# Patient Record
Sex: Female | Born: 1975 | Race: Black or African American | Hispanic: No | Marital: Single | Smoking: Never smoker
Health system: Southern US, Community
[De-identification: ages and names within clinical notes are randomized; demographics above are authoritative.]

## PROBLEM LIST (undated history)

## (undated) DIAGNOSIS — K219 Gastro-esophageal reflux disease without esophagitis: Secondary | ICD-10-CM

## (undated) DIAGNOSIS — R011 Cardiac murmur, unspecified: Secondary | ICD-10-CM

## (undated) DIAGNOSIS — F419 Anxiety disorder, unspecified: Secondary | ICD-10-CM

## (undated) HISTORY — DX: Gastro-esophageal reflux disease without esophagitis: K21.9

## (undated) HISTORY — DX: Anxiety disorder, unspecified: F41.9

## (undated) HISTORY — DX: Cardiac murmur, unspecified: R01.1

---

## 2000-06-09 ENCOUNTER — Emergency Department (HOSPITAL_COMMUNITY): Admission: EM | Admit: 2000-06-09 | Discharge: 2000-06-09 | Payer: Self-pay | Admitting: Internal Medicine

## 2001-04-26 ENCOUNTER — Encounter: Payer: Self-pay | Admitting: *Deleted

## 2001-04-26 ENCOUNTER — Ambulatory Visit (HOSPITAL_COMMUNITY): Admission: RE | Admit: 2001-04-26 | Discharge: 2001-04-26 | Payer: Self-pay | Admitting: *Deleted

## 2001-05-26 ENCOUNTER — Encounter: Payer: Self-pay | Admitting: *Deleted

## 2001-05-26 ENCOUNTER — Ambulatory Visit (HOSPITAL_COMMUNITY): Admission: RE | Admit: 2001-05-26 | Discharge: 2001-05-26 | Payer: Self-pay | Admitting: *Deleted

## 2001-09-13 ENCOUNTER — Encounter: Payer: Self-pay | Admitting: Internal Medicine

## 2001-09-13 ENCOUNTER — Ambulatory Visit (HOSPITAL_COMMUNITY): Admission: RE | Admit: 2001-09-13 | Discharge: 2001-09-13 | Payer: Self-pay | Admitting: Internal Medicine

## 2001-10-25 DIAGNOSIS — R011 Cardiac murmur, unspecified: Secondary | ICD-10-CM

## 2001-10-25 HISTORY — DX: Cardiac murmur, unspecified: R01.1

## 2001-12-19 ENCOUNTER — Other Ambulatory Visit: Admission: RE | Admit: 2001-12-19 | Discharge: 2001-12-19 | Payer: Self-pay | Admitting: Obstetrics and Gynecology

## 2002-03-05 ENCOUNTER — Ambulatory Visit (HOSPITAL_COMMUNITY): Admission: RE | Admit: 2002-03-05 | Discharge: 2002-03-05 | Payer: Self-pay | Admitting: Obstetrics and Gynecology

## 2002-03-05 ENCOUNTER — Encounter: Payer: Self-pay | Admitting: Obstetrics and Gynecology

## 2002-05-09 ENCOUNTER — Inpatient Hospital Stay (HOSPITAL_COMMUNITY): Admission: AD | Admit: 2002-05-09 | Discharge: 2002-05-09 | Payer: Self-pay | Admitting: Obstetrics and Gynecology

## 2002-05-18 ENCOUNTER — Ambulatory Visit (HOSPITAL_COMMUNITY): Admission: RE | Admit: 2002-05-18 | Discharge: 2002-05-18 | Payer: Self-pay | Admitting: Obstetrics and Gynecology

## 2002-05-18 ENCOUNTER — Encounter: Payer: Self-pay | Admitting: Obstetrics and Gynecology

## 2002-06-20 ENCOUNTER — Encounter: Payer: Self-pay | Admitting: Obstetrics and Gynecology

## 2002-06-20 ENCOUNTER — Ambulatory Visit (HOSPITAL_COMMUNITY): Admission: RE | Admit: 2002-06-20 | Discharge: 2002-06-20 | Payer: Self-pay | Admitting: Obstetrics and Gynecology

## 2002-06-29 ENCOUNTER — Inpatient Hospital Stay (HOSPITAL_COMMUNITY): Admission: AD | Admit: 2002-06-29 | Discharge: 2002-07-01 | Payer: Self-pay | Admitting: Obstetrics and Gynecology

## 2002-11-26 ENCOUNTER — Other Ambulatory Visit: Admission: RE | Admit: 2002-11-26 | Discharge: 2002-11-26 | Payer: Self-pay | Admitting: Obstetrics and Gynecology

## 2006-01-08 ENCOUNTER — Emergency Department (HOSPITAL_COMMUNITY): Admission: EM | Admit: 2006-01-08 | Discharge: 2006-01-08 | Payer: Self-pay | Admitting: Emergency Medicine

## 2013-06-01 ENCOUNTER — Other Ambulatory Visit: Payer: Self-pay | Admitting: Family Medicine

## 2013-06-01 DIAGNOSIS — R109 Unspecified abdominal pain: Secondary | ICD-10-CM

## 2013-06-01 DIAGNOSIS — R110 Nausea: Secondary | ICD-10-CM

## 2013-06-04 ENCOUNTER — Ambulatory Visit
Admission: RE | Admit: 2013-06-04 | Discharge: 2013-06-04 | Disposition: A | Discharge status: Home / Self Care | Payer: BC Managed Care – PPO | Source: Ambulatory Visit | Attending: Family Medicine | Admitting: Family Medicine

## 2013-06-04 DIAGNOSIS — R109 Unspecified abdominal pain: Secondary | ICD-10-CM

## 2013-06-04 DIAGNOSIS — R110 Nausea: Secondary | ICD-10-CM

## 2013-07-03 ENCOUNTER — Telehealth: Payer: Self-pay | Admitting: Internal Medicine

## 2013-07-03 ENCOUNTER — Encounter: Payer: Self-pay | Admitting: Internal Medicine

## 2013-07-03 NOTE — Telephone Encounter (Signed)
Spoke with patient and offered OV with extender this week. She is unable to do this. Scheduled with Mike Gip, PA on 07/10/14 at 9:30 AM.

## 2013-07-10 ENCOUNTER — Ambulatory Visit (INDEPENDENT_AMBULATORY_CARE_PROVIDER_SITE_OTHER): Payer: BC Managed Care – PPO | Admitting: Physician Assistant

## 2013-07-10 ENCOUNTER — Other Ambulatory Visit (INDEPENDENT_AMBULATORY_CARE_PROVIDER_SITE_OTHER): Payer: BC Managed Care – PPO

## 2013-07-10 ENCOUNTER — Encounter: Payer: Self-pay | Admitting: Physician Assistant

## 2013-07-10 VITALS — BP 102/68 | HR 81 | Ht 66.0 in | Wt 170.0 lb

## 2013-07-10 DIAGNOSIS — R1013 Epigastric pain: Secondary | ICD-10-CM

## 2013-07-10 DIAGNOSIS — K219 Gastro-esophageal reflux disease without esophagitis: Secondary | ICD-10-CM

## 2013-07-10 DIAGNOSIS — R110 Nausea: Secondary | ICD-10-CM

## 2013-07-10 LAB — LIPASE: Lipase: 24 U/L (ref 11.0–59.0)

## 2013-07-10 LAB — CBC WITH DIFFERENTIAL/PLATELET
Eosinophils Relative: 1.7 % (ref 0.0–5.0)
HCT: 38.5 % (ref 36.0–46.0)
Lymphs Abs: 1.8 10*3/uL (ref 0.7–4.0)
Monocytes Relative: 7.5 % (ref 3.0–12.0)
Platelets: 246 10*3/uL (ref 150.0–400.0)
RBC: 3.98 Mil/uL (ref 3.87–5.11)
WBC: 5.5 10*3/uL (ref 4.5–10.5)

## 2013-07-10 LAB — COMPREHENSIVE METABOLIC PANEL
Albumin: 3.9 g/dL (ref 3.5–5.2)
CO2: 27 mEq/L (ref 19–32)
GFR: 100.1 mL/min (ref >60.00)
Glucose, Bld: 88 mg/dL (ref 70–99)
Potassium: 3.7 mEq/L (ref 3.5–5.1)
Sodium: 139 mEq/L (ref 135–145)
Total Bilirubin: 0.4 mg/dL (ref 0.3–1.2)
Total Protein: 7.3 g/dL (ref 6.0–8.3)

## 2013-07-10 MED ORDER — HYOSCYAMINE SULFATE 0.125 MG SL SUBL: 0.1250 mg | SUBLINGUAL_TABLET | SUBLINGUAL | Status: AC | PRN

## 2013-07-10 MED ORDER — ONDANSETRON HCL 4 MG PO TABS
ORAL_TABLET | ORAL | Status: AC
Start: 2013-07-10 — End: ?

## 2013-07-10 NOTE — Patient Instructions (Addendum)
Please go to the basement level to have your labs drawn.  Stay on the Prilosec 20 mg once daily. We sent prescriptions for Zofran ( Ondansetron ) for nausea and Levsin Sublingual for spasms to  [facility] [place].   We scheduled the [facility] for 07-19-2013 at 9 Am. Arrive at 8:45 Am. Have nothing after midnight. Do not take the Prilosec ( Omeprazole ) that morning.   Location is [facility].  Entrance A, [address].

## 2013-07-10 NOTE — Progress Notes (Signed)
Reviewed and agree.

## 2013-07-10 NOTE — Progress Notes (Signed)
Subjective:    Patient ID: Pamela Jackson, female    DOB: 1976-08-05, 37 y.o.   MRN: [id number]  HPI  Pamela Jackson is a very nice 38 year old African American female known remotely to Dr. Juanda Chance from the evaluation done in 2002. She did not have any endoscopic procedures . She comes in today with complaints of intermittent episodes of abdominal pain which have been occurring since May of 2014 . She describes these as attacks or episodes and says that at times the abdominal pain is severe and may last 6-8 hours. She feels the pain in her epigastrium without radiation. Attacks are always associated with nausea without vomiting and her appetite is decreased during that time. She last had a severe attack about 5 days ago and then 2 weeks prior to that. In between these episodes she may have some days without any discomfort and other days with very mild symptoms. He says when she gets these attacks this it is a burning and pulling type of pain that is associated with gas and bloating as well as nausea. She has not had any change in her bowel habits, no melena or hematochezia. She is bothered by intermittent heartburn indigestion and feels that this epigastric pain is a completely separate problem She's not on any regular aspirin or NSAIDs. She has taken Prilosec off and on over the past 1-1/2 years and has been taking it regularly recently without any improvement in her episodic pain. She did undergo upper abdominal ultrasound through her primary care provider in August of 2014 and this was negative with no evidence of gallstones. She's not had any recent labs.    Review of Systems  Constitutional: Negative.   HENT: Negative.   Eyes: Negative.   Respiratory: Negative.   Cardiovascular: Negative.   Gastrointestinal: Positive for nausea and abdominal pain.  Endocrine: Negative.   Genitourinary: Negative.   Musculoskeletal: Negative.   Skin: Negative.   Allergic/Immunologic: Negative.   Neurological:  Negative.   Hematological: Negative.   Psychiatric/Behavioral: Negative.    Outpatient Encounter Prescriptions as of 07/10/2013  Medication Sig Dispense Refill  . omeprazole (PRILOSEC) 20 MG capsule Take 20 mg by mouth 2 (two) times daily.      . hyoscyamine (LEVSIN SL) 0.125 MG SL tablet Place 1 tablet (0.125 mg total) under the tongue every 4 (four) hours as needed for cramping.  20 tablet  0  . ondansetron (ZOFRAN) 4 MG tablet Take 1 tab every 6 hours as needed for pain.  30 tablet  0   No facility-administered encounter medications on file as of 07/10/2013.    No Known Allergies There are no active problems to display for this patient.  History  Substance Use Topics  . Smoking status: Never Smoker   . Smokeless tobacco: Never Used  . Alcohol Use: No   family history includes Breast cancer in her mother; Diabetes in her maternal grandmother and paternal grandmother; Lung cancer in her father.     Objective:   Physical Exam  well-developed healthy-appearing young African American female in no acute distress, pleasant. Blood pressure 102/68 pulse 80 one height 5 foot 6 weight 170. HEENT; nontraumatic normocephalic EOMI PERRLA sclera anicteric, Supple; no JVD, Cardiovascula;r regular rate and rhythm with S1-S2 no murmur or gallop, Pulmonary; clear bilaterally, Abdomen; soft basically nontender nondistended bowel sounds are active there is no palpable mass or hepatosplenomegaly, Recta;l exam not done, Extremities; no clubbing cyanosis or edema skin warm and dry, Psych; mood and affect normal and  appropriate        Assessment & Plan:  #23  37 year old female with intermittent episodes of epigastric pain and nausea lasting 6-8 hours over the past several months. Her symptoms are nonresponsive to PPI therapy and upper abdominal ultrasound was negative. Etiology of her pain is unclear, will need to rule out biliary dyskinesia, IBS, gastritis or peptic ulcer disease  Plan; CBC with  differential, CMET,and lipase Schedule for CCK HIDA scan If above workup is unrevealing she will need EGD with Dr. Juanda Chance Continue Prilosec 20 mg by mouth daily in the interim Add Zofran 4 mg every 6 hours when necessary nausea Add Levsin sublingual one on the tongue at the onset of any episode of significant pain

## 2013-07-19 ENCOUNTER — Other Ambulatory Visit: Payer: Self-pay | Admitting: *Deleted

## 2013-07-19 ENCOUNTER — Encounter (HOSPITAL_COMMUNITY)
Admission: RE | Admit: 2013-07-19 | Discharge: 2013-07-19 | Disposition: A | Discharge status: Home / Self Care | Payer: BC Managed Care – PPO | Source: Ambulatory Visit | Attending: Physician Assistant | Admitting: Physician Assistant

## 2013-07-19 DIAGNOSIS — R1013 Epigastric pain: Secondary | ICD-10-CM | POA: Insufficient documentation

## 2013-07-19 DIAGNOSIS — K219 Gastro-esophageal reflux disease without esophagitis: Secondary | ICD-10-CM | POA: Insufficient documentation

## 2013-07-19 DIAGNOSIS — R1010 Upper abdominal pain, unspecified: Secondary | ICD-10-CM

## 2013-07-19 DIAGNOSIS — R110 Nausea: Secondary | ICD-10-CM | POA: Insufficient documentation

## 2013-07-19 MED ORDER — TECHNETIUM TC 99M MEBROFENIN IV KIT
5.0000 | PACK | Freq: Once | INTRAVENOUS | Status: AC | PRN
  Administered 2013-07-19: 5 via INTRAVENOUS

## 2013-07-24 ENCOUNTER — Other Ambulatory Visit: Payer: Self-pay | Admitting: *Deleted

## 2013-07-24 ENCOUNTER — Ambulatory Visit (INDEPENDENT_AMBULATORY_CARE_PROVIDER_SITE_OTHER)
Admission: RE | Admit: 2013-07-24 | Discharge: 2013-07-24 | Disposition: A | Discharge status: Home / Self Care | Payer: BC Managed Care – PPO | Source: Ambulatory Visit | Attending: Physician Assistant | Admitting: Physician Assistant

## 2013-07-24 DIAGNOSIS — R109 Unspecified abdominal pain: Secondary | ICD-10-CM

## 2013-07-24 DIAGNOSIS — R110 Nausea: Secondary | ICD-10-CM

## 2013-07-24 DIAGNOSIS — R1010 Upper abdominal pain, unspecified: Secondary | ICD-10-CM

## 2013-07-24 DIAGNOSIS — R1013 Epigastric pain: Secondary | ICD-10-CM

## 2013-07-24 MED ORDER — IOHEXOL 300 MG/ML  SOLN
100.0000 mL | Freq: Once | INTRAMUSCULAR | Status: AC | PRN
  Administered 2013-07-24: 100 mL via INTRAVENOUS

## 2013-07-25 ENCOUNTER — Encounter: Payer: Self-pay | Admitting: Internal Medicine

## 2013-07-25 ENCOUNTER — Ambulatory Visit (AMBULATORY_SURGERY_CENTER): Payer: Self-pay | Admitting: *Deleted

## 2013-07-25 VITALS — Ht 66.0 in | Wt 171.6 lb

## 2013-07-25 DIAGNOSIS — R1013 Epigastric pain: Secondary | ICD-10-CM

## 2013-07-25 NOTE — Progress Notes (Signed)
No egg or soy allergy. No anesthesia problems.  

## 2013-08-01 ENCOUNTER — Encounter: Payer: Self-pay | Admitting: Internal Medicine

## 2013-08-01 ENCOUNTER — Ambulatory Visit (AMBULATORY_SURGERY_CENTER): Payer: BC Managed Care – PPO | Admitting: Internal Medicine

## 2013-08-01 VITALS — BP 112/78 | HR 78 | Temp 99.9°F | Resp 15 | Ht 66.0 in | Wt 171.0 lb

## 2013-08-01 DIAGNOSIS — K2990 Gastroduodenitis, unspecified, without bleeding: Secondary | ICD-10-CM

## 2013-08-01 DIAGNOSIS — K2970 Gastritis, unspecified, without bleeding: Secondary | ICD-10-CM

## 2013-08-01 DIAGNOSIS — R1013 Epigastric pain: Secondary | ICD-10-CM

## 2013-08-01 MED ORDER — HYOSCYAMINE SULFATE 0.125 MG SL SUBL: 0.1250 mg | SUBLINGUAL_TABLET | SUBLINGUAL | Status: AC | PRN

## 2013-08-01 MED ORDER — SODIUM CHLORIDE 0.9 % IV SOLN: 500.0000 mL | INTRAVENOUS | Status: DC

## 2013-08-01 NOTE — Progress Notes (Signed)
Called to room to assist during endoscopic procedure.  Patient ID and intended procedure confirmed with present staff. Received instructions for my participation in the procedure from the performing physician.  

## 2013-08-01 NOTE — Progress Notes (Signed)
Lidocaine-40mg IV prior to Propofol InductionPropofol given over incremental dosages 

## 2013-08-01 NOTE — Progress Notes (Signed)
Patient did not have preoperative order for IV antibiotic SSI prophylaxis. (G8918)Patient did not experience any of the following events: a burn prior to discharge; a fall within the facility; wrong site/side/patient/procedure/implant event; or a hospital transfer or hospital admission upon discharge from the facility. (G8907)Patient did not experience any of the following events: a burn prior to discharge; a fall within the facility; wrong site/side/patient/procedure/implant event; or a hospital transfer or hospital admission upon discharge from the facility. (G8907) 

## 2013-08-01 NOTE — Op Note (Signed)
[facility] [address] [place], [address]   ENDOSCOPY PROCEDURE REPORT  PATIENT: Pamela Jackson, Pamela Jackson  MR#: [id number] BIRTHDATE: 10/18/1976 , 36  yrs. old GENDER: Female ENDOSCOPIST: Hart Carwin, MD REFERRED BY:  Alwyn Pea, M.D. PROCEDURE DATE:  08/01/2013 PROCEDURE:  EGD w/ biopsy ASA CLASS:     Class I INDICATIONS:  Epigastric pain.   Nausea., normal sono, HIDA, CT scan of the abdomen MEDICATIONS: MAC sedation, administered by CRNA and propofol (Diprivan) 250mg  IV TOPICAL ANESTHETIC: Cetacaine Spray  DESCRIPTION OF PROCEDURE: After the risks benefits and alternatives of the procedure were thoroughly explained, informed consent was obtained.  The LB [id number] endoscope was introduced through the mouth and advanced to the second portion of the duodenum. Without limitations.  The instrument was slowly withdrawn as the mucosa was fully examined.      Esophagus:[  some of the mid and distal esophageal mucosa appeared normal. Z line was unremarkable. There was no stricture there was 1 cm hiatal hernia Stomach: Rugal folds were unremarkable ,there was  minimal patchy erythema of the gastric antrum. Retroflexion of the endoscope revealed normal fundus and cardia. Gastric outlet was normal. Biopsies were taken from the gastric antrum to rule out H. pyloric      The scope was then withdrawn from the patient and the procedure completed. duodenum: Duodenal bulb and descending duodenum was normal  COMPLICATIONS: There were no complications. ENDOSCOPIC IMPRESSION: essentially normal upper endoscopy of the esophagus stomach and duodenum with minimal gastritis in the gastric antrum. Rule out H. pylori RECOMMENDATIONS: 1.  Await pathology results 2.  Continue PPI  REPEAT EXAM: for EGD pending biopsy results.  eSigned:  Hart Carwin, MD 08/01/2013 4:54 PM   CC:

## 2013-08-01 NOTE — Patient Instructions (Signed)

## 2013-08-02 ENCOUNTER — Telehealth: Payer: Self-pay | Admitting: *Deleted

## 2013-08-02 NOTE — Telephone Encounter (Signed)
  Follow up Call-  Call back number 08/01/2013  Post procedure Call Back phone  # [phone]  Permission to leave phone message Yes     Patient questions:  Do you have a fever, pain , or abdominal swelling? no Pain Score  0 *  Have you tolerated food without any problems? yes  Have you been able to return to your normal activities? yes  Do you have any questions about your discharge instructions: Diet   no Medications  no Follow up visit  no  Do you have questions or concerns about your Care? no  Actions: * If pain score is 4 or above: No action needed, pain <4.

## 2013-08-07 ENCOUNTER — Encounter: Payer: Self-pay | Admitting: Internal Medicine

## 2013-08-24 ENCOUNTER — Ambulatory Visit: Payer: BC Managed Care – PPO | Admitting: Internal Medicine

## 2013-08-30 ENCOUNTER — Other Ambulatory Visit: Payer: Self-pay

## 2019-01-02 ENCOUNTER — Other Ambulatory Visit: Payer: Self-pay | Admitting: Family Medicine

## 2019-01-02 DIAGNOSIS — Z1231 Encounter for screening mammogram for malignant neoplasm of breast: Secondary | ICD-10-CM

## 2019-01-26 ENCOUNTER — Ambulatory Visit: Payer: Self-pay

## 2019-03-21 ENCOUNTER — Ambulatory Visit
Admission: RE | Admit: 2019-03-21 | Discharge: 2019-03-21 | Disposition: A | Discharge status: Home / Self Care | Payer: BLUE CROSS/BLUE SHIELD | Source: Ambulatory Visit | Attending: Family Medicine | Admitting: Family Medicine

## 2019-03-21 ENCOUNTER — Other Ambulatory Visit: Payer: Self-pay

## 2019-03-21 DIAGNOSIS — Z1231 Encounter for screening mammogram for malignant neoplasm of breast: Secondary | ICD-10-CM

## 2019-03-22 ENCOUNTER — Other Ambulatory Visit: Payer: Self-pay | Admitting: Family Medicine

## 2019-03-22 DIAGNOSIS — R928 Other abnormal and inconclusive findings on diagnostic imaging of breast: Secondary | ICD-10-CM

## 2019-04-03 ENCOUNTER — Ambulatory Visit
Admission: RE | Admit: 2019-04-03 | Discharge: 2019-04-03 | Disposition: A | Discharge status: Home / Self Care | Payer: BLUE CROSS/BLUE SHIELD | Source: Ambulatory Visit | Attending: Family Medicine | Admitting: Family Medicine

## 2019-04-03 ENCOUNTER — Other Ambulatory Visit: Payer: Self-pay

## 2019-04-03 ENCOUNTER — Other Ambulatory Visit: Payer: Self-pay | Admitting: Family Medicine

## 2019-04-03 ENCOUNTER — Ambulatory Visit: Payer: BLUE CROSS/BLUE SHIELD

## 2019-04-03 DIAGNOSIS — R928 Other abnormal and inconclusive findings on diagnostic imaging of breast: Secondary | ICD-10-CM

## 2020-05-21 ENCOUNTER — Other Ambulatory Visit: Payer: Self-pay | Admitting: Family Medicine

## 2020-05-21 DIAGNOSIS — Z1231 Encounter for screening mammogram for malignant neoplasm of breast: Secondary | ICD-10-CM

## 2020-06-03 ENCOUNTER — Other Ambulatory Visit: Payer: Self-pay | Admitting: Family Medicine

## 2020-06-03 ENCOUNTER — Ambulatory Visit: Payer: BLUE CROSS/BLUE SHIELD

## 2020-06-03 DIAGNOSIS — Z1231 Encounter for screening mammogram for malignant neoplasm of breast: Secondary | ICD-10-CM

## 2020-06-13 ENCOUNTER — Ambulatory Visit
Admission: RE | Admit: 2020-06-13 | Discharge: 2020-06-13 | Disposition: A | Discharge status: Home / Self Care | Payer: BC Managed Care – PPO | Source: Ambulatory Visit

## 2020-06-13 ENCOUNTER — Other Ambulatory Visit: Payer: Self-pay

## 2020-06-13 DIAGNOSIS — Z1231 Encounter for screening mammogram for malignant neoplasm of breast: Secondary | ICD-10-CM

## 2020-10-30 ENCOUNTER — Ambulatory Visit: Payer: BC Managed Care – PPO | Attending: Internal Medicine

## 2020-10-30 DIAGNOSIS — Z23 Encounter for immunization: Secondary | ICD-10-CM

## 2020-10-30 NOTE — Progress Notes (Signed)
   Covid-19 Vaccination Clinic  Name:  [name]    MRN: [id number] DOB: [birth year]  10/30/2020  [name] was observed post Covid-19 immunization for 15 minutes without incident. She was provided with Vaccine Information Sheet and instruction to access the V-Safe system.   [name] was instructed to call 911 with any severe reactions post vaccine: Marland Kitchen Difficulty breathing  . Swelling of face and throat  . A fast heartbeat  . A bad rash all over body  . Dizziness and weakness   Immunizations Administered    Name Date Dose VIS Date Route   Moderna Covid-19 Booster Vaccine 10/30/2020  3:22 PM 0.25 mL 08/13/2020 Intramuscular   Manufacturer: Gala Murdoch   Lot: [id number]   NDC: 09233-007-62

## 2020-11-06 ENCOUNTER — Other Ambulatory Visit: Payer: BC Managed Care – PPO

## 2020-11-06 DIAGNOSIS — Z20822 Contact with and (suspected) exposure to covid-19: Secondary | ICD-10-CM

## 2020-11-11 LAB — NOVEL CORONAVIRUS, NAA: SARS-CoV-2, NAA: NOT DETECTED

## 2021-05-22 ENCOUNTER — Other Ambulatory Visit: Payer: Self-pay | Admitting: Nurse Practitioner

## 2021-05-22 DIAGNOSIS — Z1231 Encounter for screening mammogram for malignant neoplasm of breast: Secondary | ICD-10-CM

## 2021-07-14 ENCOUNTER — Ambulatory Visit
Admission: RE | Admit: 2021-07-14 | Discharge: 2021-07-14 | Disposition: A | Discharge status: Home / Self Care | Payer: BC Managed Care – PPO | Source: Ambulatory Visit

## 2021-07-14 ENCOUNTER — Other Ambulatory Visit: Payer: Self-pay

## 2021-07-14 DIAGNOSIS — Z1231 Encounter for screening mammogram for malignant neoplasm of breast: Secondary | ICD-10-CM

## 2022-06-11 ENCOUNTER — Other Ambulatory Visit: Payer: Self-pay | Admitting: Nurse Practitioner

## 2022-06-11 DIAGNOSIS — Z1231 Encounter for screening mammogram for malignant neoplasm of breast: Secondary | ICD-10-CM

## 2022-07-15 ENCOUNTER — Ambulatory Visit: Payer: BC Managed Care – PPO

## 2022-07-16 ENCOUNTER — Ambulatory Visit
Admission: RE | Admit: 2022-07-16 | Discharge: 2022-07-16 | Disposition: A | Discharge status: Home / Self Care | Payer: BC Managed Care – PPO | Source: Ambulatory Visit | Attending: Nurse Practitioner | Admitting: Nurse Practitioner

## 2022-07-16 DIAGNOSIS — Z1231 Encounter for screening mammogram for malignant neoplasm of breast: Secondary | ICD-10-CM

## 2022-07-20 ENCOUNTER — Other Ambulatory Visit: Payer: Self-pay | Admitting: Nurse Practitioner

## 2022-07-20 DIAGNOSIS — R928 Other abnormal and inconclusive findings on diagnostic imaging of breast: Secondary | ICD-10-CM

## 2022-08-05 ENCOUNTER — Ambulatory Visit
Admission: RE | Admit: 2022-08-05 | Discharge: 2022-08-05 | Disposition: A | Discharge status: Home / Self Care | Payer: BC Managed Care – PPO | Source: Ambulatory Visit | Attending: Nurse Practitioner | Admitting: Nurse Practitioner

## 2022-08-05 ENCOUNTER — Ambulatory Visit: Payer: BC Managed Care – PPO

## 2022-08-05 DIAGNOSIS — R928 Other abnormal and inconclusive findings on diagnostic imaging of breast: Secondary | ICD-10-CM

## 2022-08-23 ENCOUNTER — Other Ambulatory Visit: Payer: BC Managed Care – PPO

## 2023-05-16 IMAGING — MG MM DIGITAL SCREENING BILAT W/ TOMO AND CAD
8 series · 8 of 24 positions shown · non-contrast
Comparison: Previous exam(s).

CLINICAL DATA: Screening.

EXAM:
DIGITAL SCREENING BILATERAL MAMMOGRAM WITH TOMOSYNTHESIS AND CAD
TECHNIQUE: Bilateral screening digital craniocaudal and mediolateral oblique
mammograms were obtained. Bilateral screening digital breast
tomosynthesis was performed. The images were evaluated with
computer-aided detection.

[L MLO synth-2D]
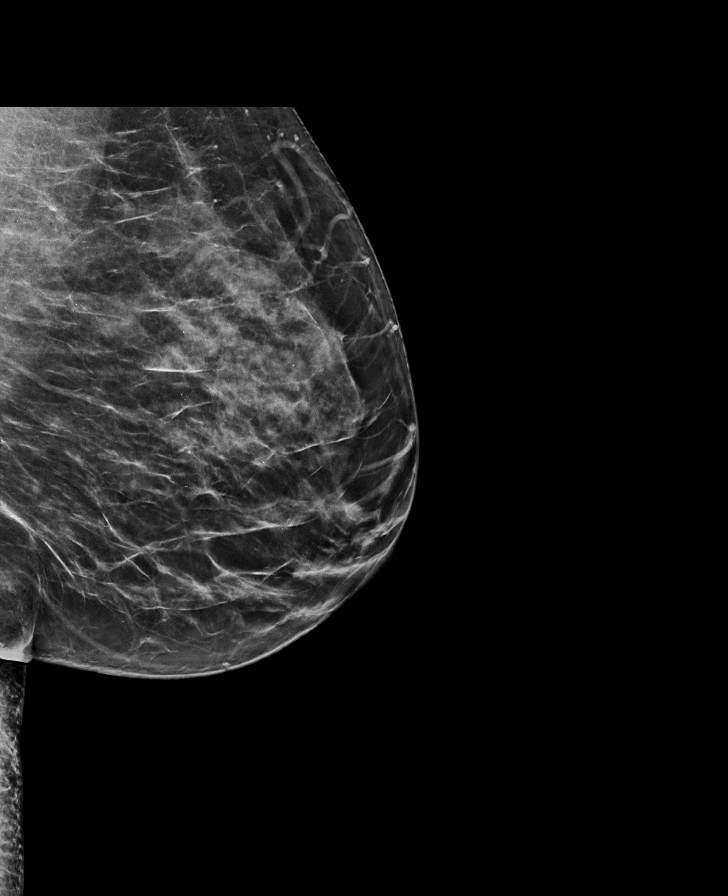

[R MLO synth-2D]
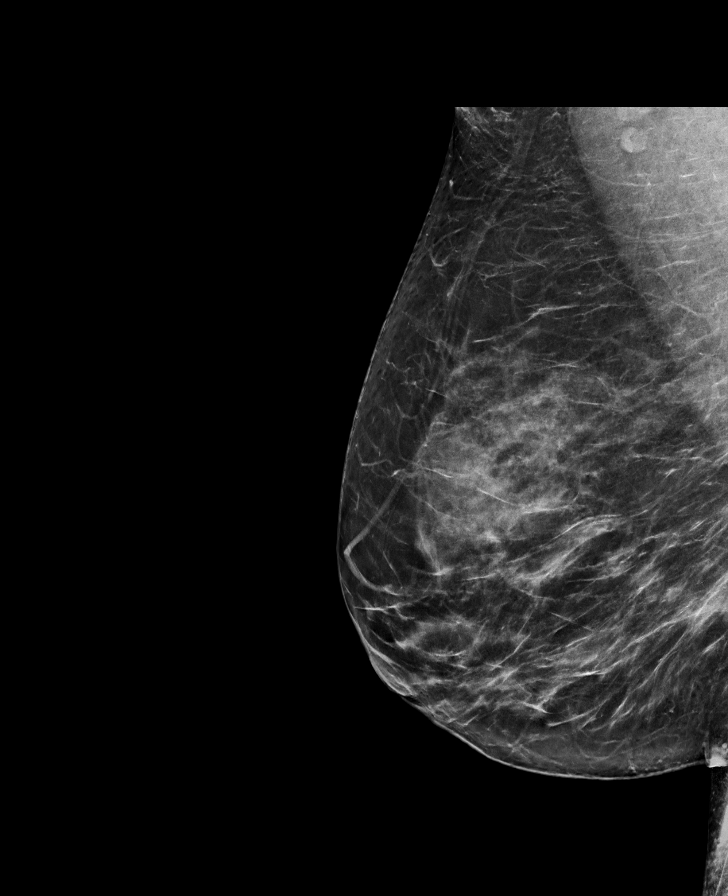

[L CC synth-2D]
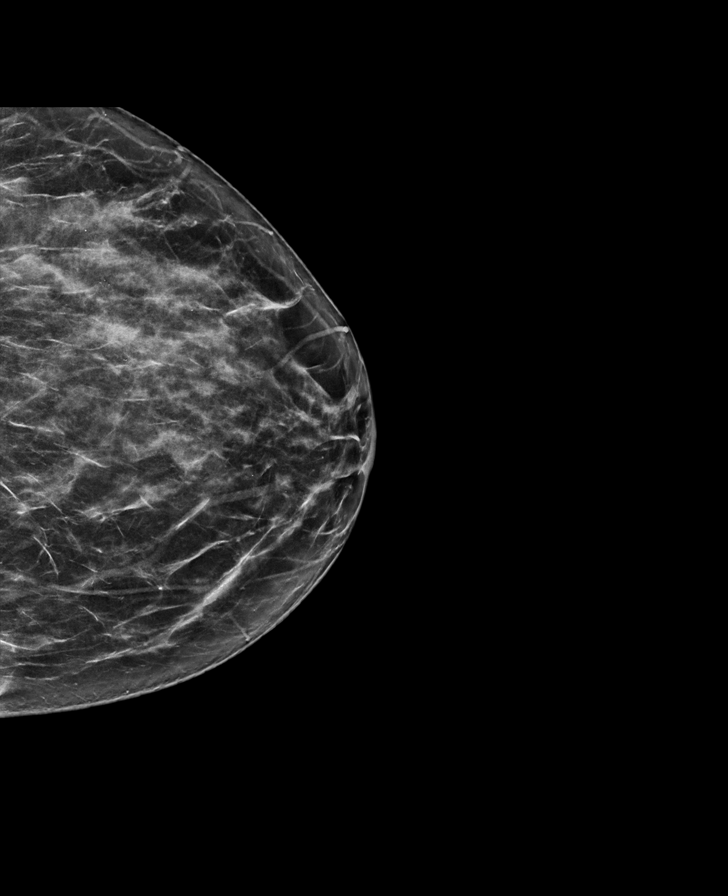

[R CC synth-2D]
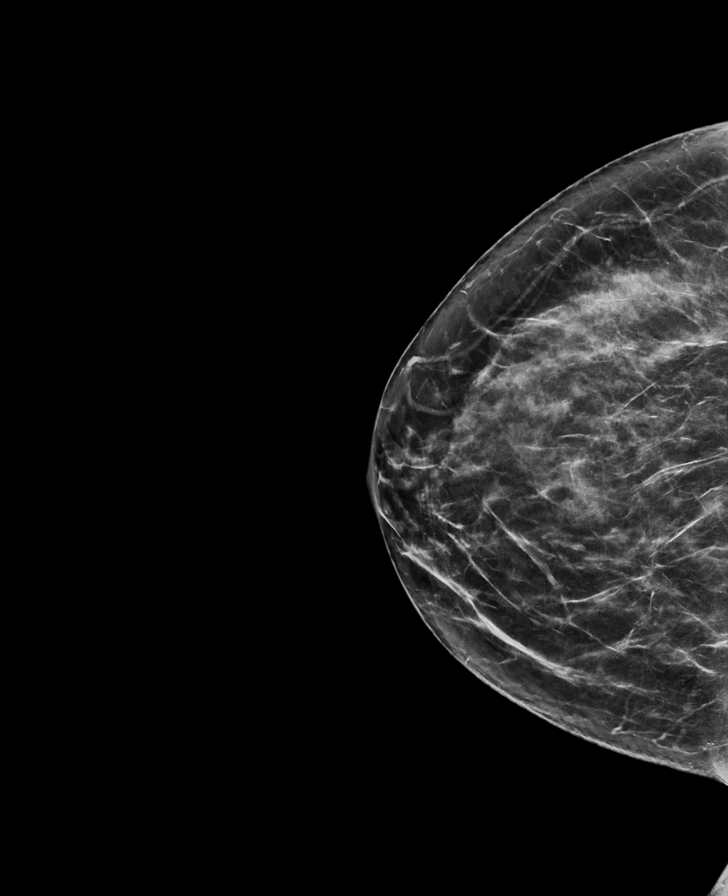

[L CC tomo · tomo slice 35/69.0]
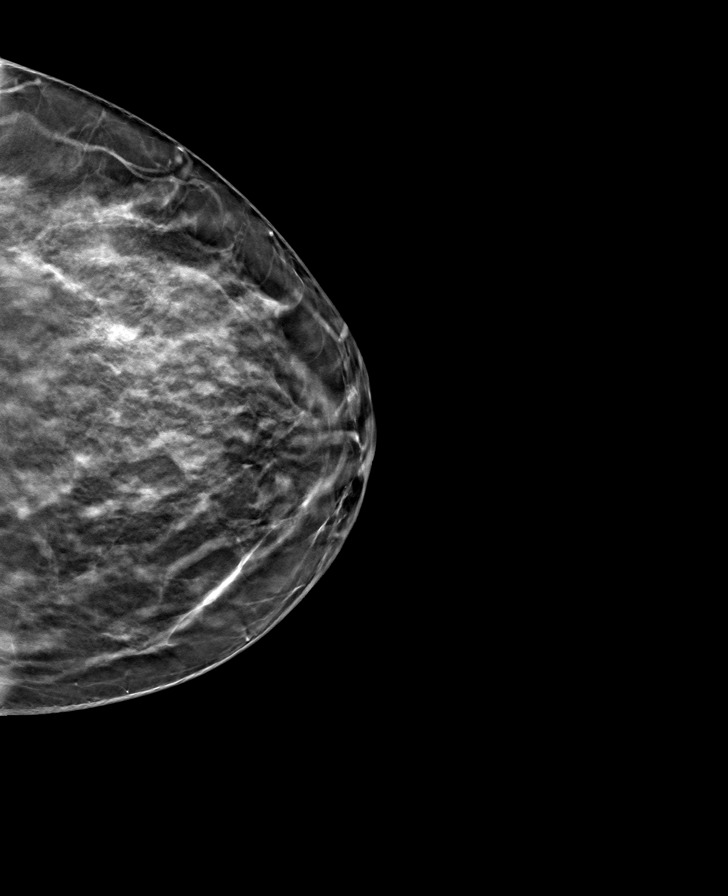

[R MLO tomo · tomo slice 41/82.0]
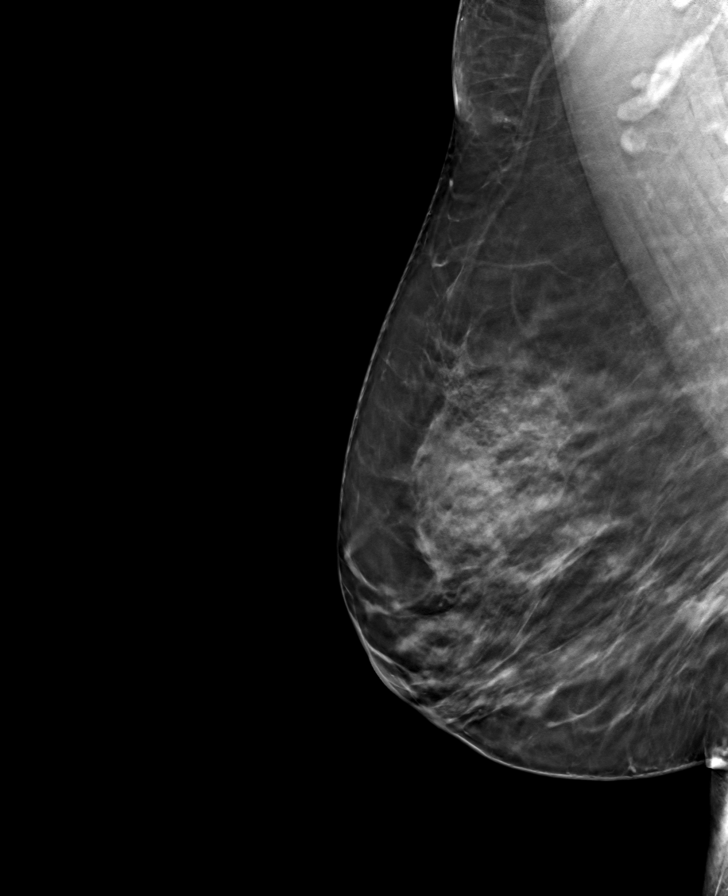

[R CC tomo · tomo slice 37/72.0]
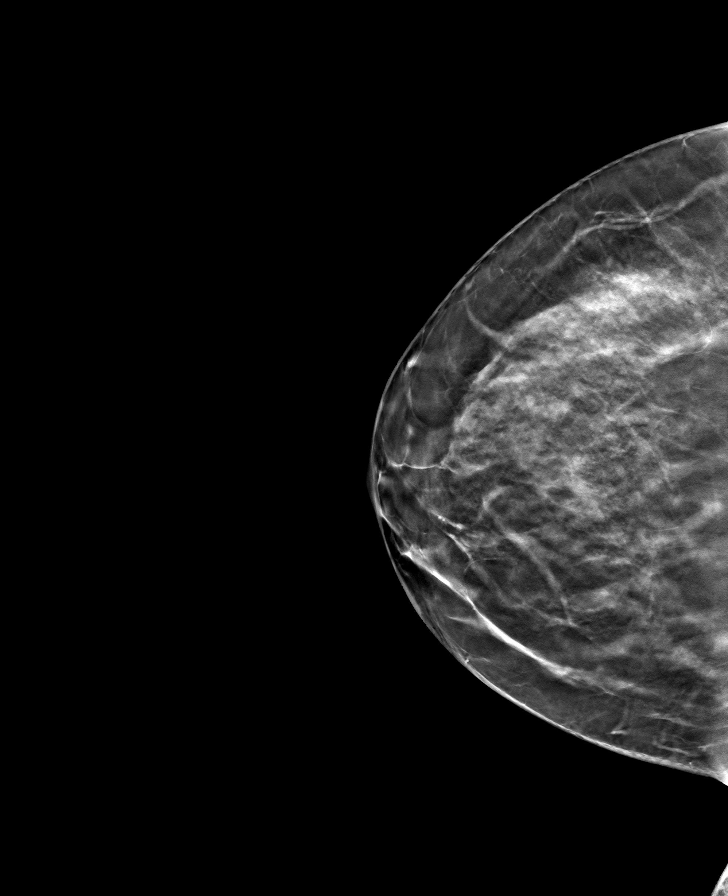

[L MLO tomo · tomo slice 36/71.0]
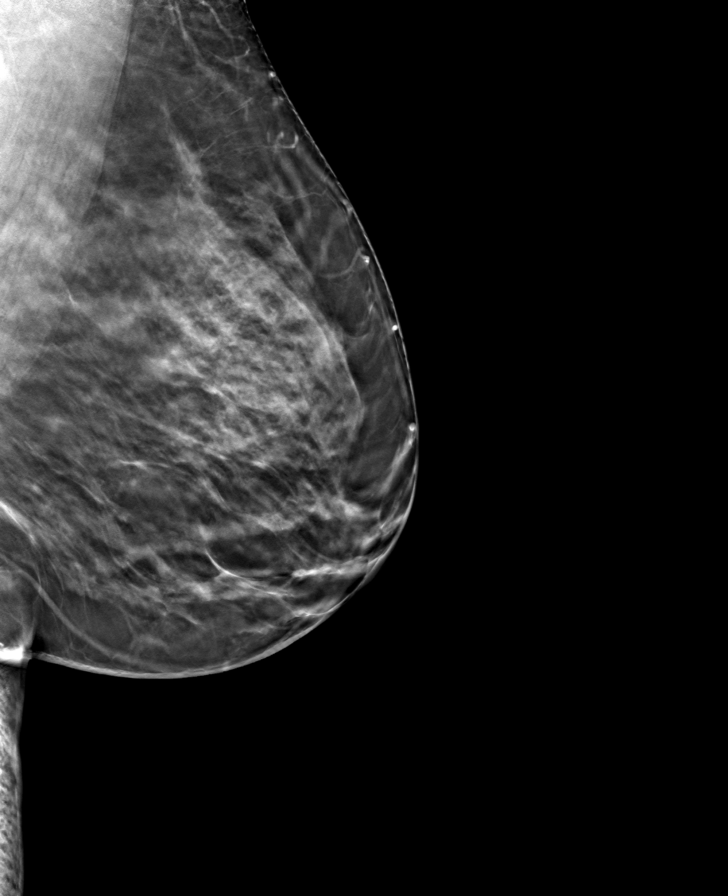

[8 of 24 positions shown; findings below may reference images not displayed]

ACR Breast Density Category c: The breast tissue is heterogeneously
dense, which may obscure small masses.
FINDINGS: There are no findings suspicious for malignancy.
IMPRESSION: No mammographic evidence of malignancy. A result letter of this
screening mammogram will be mailed directly to the patient.

RECOMMENDATION:
Screening mammogram in one year. (Code:Q3-W-BC3)

BI-RADS CATEGORY  1: Negative.

## 2023-06-24 ENCOUNTER — Other Ambulatory Visit: Payer: Self-pay | Admitting: Nurse Practitioner

## 2023-06-24 DIAGNOSIS — Z1231 Encounter for screening mammogram for malignant neoplasm of breast: Secondary | ICD-10-CM

## 2023-08-08 ENCOUNTER — Ambulatory Visit
Admission: RE | Admit: 2023-08-08 | Discharge: 2023-08-08 | Disposition: A | Discharge status: Home / Self Care | Payer: BC Managed Care – PPO | Source: Ambulatory Visit | Attending: Nurse Practitioner | Admitting: Nurse Practitioner

## 2023-08-08 DIAGNOSIS — Z1231 Encounter for screening mammogram for malignant neoplasm of breast: Secondary | ICD-10-CM

## 2024-07-06 ENCOUNTER — Other Ambulatory Visit: Payer: Self-pay | Admitting: Nurse Practitioner

## 2024-07-06 DIAGNOSIS — Z1231 Encounter for screening mammogram for malignant neoplasm of breast: Secondary | ICD-10-CM

## 2024-07-26 ENCOUNTER — Ambulatory Visit
Admission: RE | Admit: 2024-07-26 | Discharge: 2024-07-26 | Disposition: A | Discharge status: Home / Self Care | Source: Ambulatory Visit | Attending: Nurse Practitioner | Admitting: Nurse Practitioner

## 2024-07-26 DIAGNOSIS — Z1231 Encounter for screening mammogram for malignant neoplasm of breast: Secondary | ICD-10-CM

## 2024-08-17 ENCOUNTER — Ambulatory Visit
Admission: RE | Admit: 2024-08-17 | Discharge: 2024-08-17 | Disposition: A | Discharge status: Home / Self Care | Source: Ambulatory Visit | Attending: Nurse Practitioner | Admitting: Nurse Practitioner
